# Patient Record
Sex: Female | Born: 1990 | Race: White | Hispanic: No | Marital: Single | Smoking: Never smoker
Health system: Southern US, Community
[De-identification: ages and names within clinical notes are randomized; demographics above are authoritative.]

## PROBLEM LIST (undated history)

## (undated) DIAGNOSIS — F909 Attention-deficit hyperactivity disorder, unspecified type: Secondary | ICD-10-CM

## (undated) HISTORY — PX: WISDOM TOOTH EXTRACTION: SHX21

## (undated) HISTORY — PX: TONSILLECTOMY: SHX5217

## (undated) HISTORY — PX: EXPOSURE OF IMPACTED TOOTH: SHX1546

---

## 2013-08-01 ENCOUNTER — Encounter: Payer: Self-pay | Admitting: Obstetrics and Gynecology

## 2013-08-01 ENCOUNTER — Ambulatory Visit (INDEPENDENT_AMBULATORY_CARE_PROVIDER_SITE_OTHER): Payer: Managed Care, Other (non HMO) | Admitting: Obstetrics and Gynecology

## 2013-08-01 ENCOUNTER — Encounter (INDEPENDENT_AMBULATORY_CARE_PROVIDER_SITE_OTHER): Payer: Self-pay

## 2013-08-01 VITALS — BP 146/90 | Ht 71.0 in | Wt 235.0 lb

## 2013-08-01 DIAGNOSIS — Z309 Encounter for contraceptive management, unspecified: Secondary | ICD-10-CM

## 2013-08-01 DIAGNOSIS — Z3049 Encounter for surveillance of other contraceptives: Secondary | ICD-10-CM

## 2013-08-01 DIAGNOSIS — N926 Irregular menstruation, unspecified: Secondary | ICD-10-CM

## 2013-08-01 DIAGNOSIS — N939 Abnormal uterine and vaginal bleeding, unspecified: Secondary | ICD-10-CM | POA: Insufficient documentation

## 2013-08-01 MED ORDER — LEVONORGEST-ETH ESTRAD 91-DAY 0.15-0.03 &0.01 MG PO TABS: 1.0000 | ORAL_TABLET | Freq: Every day | ORAL | Status: DC

## 2013-08-01 NOTE — Progress Notes (Signed)
Patient ID: Rebecca Dixon, female   DOB: 1991/06/14, 22 y.o.   MRN: [id number] Pt states that she has irregular periods since May. BC was changed about 2 months ago but no improvement.  Hx irregular cycles on ocp's . Cycles were irregular and heavy prior to ocp.  Menarche: age 30. Ocp started at age 45. Has had episodes of skipped withdrawal bleeding, as well as the cycle , when it occurs, extends up to 7-10 days of bleeding into the active pills. Take pills regularly. Prior pills worked on [employer] research [employer]. Pt interested in continuous OCP use , will switch to [facility]. A; contr management

## 2013-08-01 NOTE — Patient Instructions (Signed)
Switch ocp's at the end of your current cycles.

## 2014-01-16 ENCOUNTER — Other Ambulatory Visit (HOSPITAL_COMMUNITY): Payer: Self-pay | Admitting: General Surgery

## 2014-01-16 DIAGNOSIS — R109 Unspecified abdominal pain: Secondary | ICD-10-CM

## 2014-01-24 ENCOUNTER — Ambulatory Visit (HOSPITAL_COMMUNITY)
Admission: RE | Admit: 2014-01-24 | Discharge: 2014-01-24 | Disposition: A | Discharge status: Home / Self Care | Payer: 59 | Source: Ambulatory Visit | Attending: General Surgery | Admitting: General Surgery

## 2014-01-24 DIAGNOSIS — R109 Unspecified abdominal pain: Secondary | ICD-10-CM | POA: Insufficient documentation

## 2014-01-24 DIAGNOSIS — R160 Hepatomegaly, not elsewhere classified: Secondary | ICD-10-CM | POA: Insufficient documentation

## 2014-01-29 ENCOUNTER — Other Ambulatory Visit (HOSPITAL_COMMUNITY): Payer: Self-pay | Admitting: General Surgery

## 2014-01-29 DIAGNOSIS — R109 Unspecified abdominal pain: Secondary | ICD-10-CM

## 2014-02-03 ENCOUNTER — Encounter (HOSPITAL_COMMUNITY)
Admission: RE | Admit: 2014-02-03 | Discharge: 2014-02-03 | Disposition: A | Discharge status: Home / Self Care | Payer: 59 | Source: Ambulatory Visit | Attending: General Surgery | Admitting: General Surgery

## 2014-02-03 ENCOUNTER — Encounter (HOSPITAL_COMMUNITY): Payer: Self-pay

## 2014-02-03 DIAGNOSIS — R109 Unspecified abdominal pain: Secondary | ICD-10-CM | POA: Insufficient documentation

## 2014-02-03 MED ORDER — TECHNETIUM TC 99M MEBROFENIN IV KIT
5.0000 | PACK | Freq: Once | INTRAVENOUS | Status: AC | PRN
  Administered 2014-02-03: 5 via INTRAVENOUS

## 2014-02-03 MED ORDER — STERILE WATER FOR INJECTION IJ SOLN
INTRAMUSCULAR | Status: AC
  Administered 2014-02-03: 2.18 mL via INTRAVENOUS
  Filled 2014-02-03: qty 10

## 2014-02-03 MED ORDER — SINCALIDE 5 MCG IJ SOLR
INTRAMUSCULAR | Status: AC
  Administered 2014-02-03: 2.18 ug via INTRAVENOUS
  Filled 2014-02-03: qty 5

## 2014-06-12 NOTE — H&P (Signed)
  NTS SOAP Note  Vital Signs:  Vitals as of: 06/12/2014: Systolic 137: Diastolic 92: Heart Rate 72: Temp 98.23F: Height 73ft 11in: Weight 242Lbs 0 Ounces: BMI 33.75  BMI : 33.75 kg/m2  Subjective: This 23 Years old Female presents for of abdominal pain.  Has been having intermittent upper abdominal pain with some radiation to the right flank, nausea, bloating, and fatty food intolerance for some time now.  No fever, chills, jaundice.  Review of Symptoms:  Constitutional:unremarkable   Head:unremarkable    Eyes:unremarkable   Nose/Mouth/Throat:unremarkable Cardiovascular:  unremarkable   Respiratory:unremarkable   Gastrointestin    abdominal pain, nausea, heartburn, excessive gas Genitourinary:unremarkable     Musculoskeletal:unremarkable   Skin:unremarkable Hematolgic/Lymphatic:unremarkable     Allergic/Immunologic:unremarkable     Past Medical History:    Reviewed  Past Medical History  Surgical History: tonsillectomy Medical Problems: adhd Allergies: pcn, codeine Medications: aderall, xanax, seasonique   Social History:Reviewed  Social History  Preferred Language: English Race:  White Ethnicity: Not Hispanic / Latino Age: 70 Years    Smoking Status: Never smoker reviewed on 01/16/2014 Functional Status reviewed on 01/16/2014 ------------------------------------------------ Bathing: Normal Cooking: Normal Dressing: Normal Driving: Normal Eating: Normal Managing Meds: Normal Oral Care: Normal Shopping: Normal Toileting: Normal Transferring: Normal Walking: Normal Cognitive Status reviewed on 01/16/2014 ------------------------------------------------ Attention: Normal Decision Making: Normal Language: Normal Memory: Normal Motor: Normal Perception: Normal Problem Solving: Normal Visual and Spatial: Normal   Family History:  Reviewed  Family Health History Mother, Living; Healthy;  Father, Living;  Healthy;     Objective Information: General:  Well appearing, well nourished in no distress.   no scleral icterus Heart:  RRR, no murmur or gallop.  Normal S1, S2.  No S3, S4.  Lungs:    CTA bilaterally, no wheezes, rhonchi, rales.  Breathing unlabored. Abdomen:Soft, NT/ND, no HSM, no masses.  Assessment:Abdominal pain and symptoms of unknown etiology  Diagnoses: Chronic cholecystitis  Procedures: 30865 - OFFICE OUTPATIENT NEW 30 MINUTES    Plan:  Scheduled for laparoscopic cholecystectomy.

## 2014-06-24 ENCOUNTER — Encounter (HOSPITAL_COMMUNITY): Payer: Self-pay | Admitting: Pharmacy Technician

## 2014-07-04 ENCOUNTER — Encounter (HOSPITAL_COMMUNITY): Payer: Self-pay

## 2014-07-04 ENCOUNTER — Encounter (HOSPITAL_COMMUNITY)
Admission: RE | Admit: 2014-07-04 | Discharge: 2014-07-04 | Disposition: A | Discharge status: Home / Self Care | Payer: 59 | Source: Ambulatory Visit | Attending: General Surgery | Admitting: General Surgery

## 2014-07-04 DIAGNOSIS — Z01812 Encounter for preprocedural laboratory examination: Secondary | ICD-10-CM | POA: Diagnosis present

## 2014-07-04 DIAGNOSIS — K811 Chronic cholecystitis: Secondary | ICD-10-CM | POA: Diagnosis present

## 2014-07-04 HISTORY — DX: Attention-deficit hyperactivity disorder, unspecified type: F90.9

## 2014-07-04 LAB — CBC WITH DIFFERENTIAL/PLATELET
Basophils Absolute: 0 10*3/uL (ref 0.0–0.1)
Basophils Relative: 0 % (ref 0–1)
Eosinophils Absolute: 0.2 10*3/uL (ref 0.0–0.7)
Eosinophils Relative: 2 % (ref 0–5)
HEMATOCRIT: 38 % (ref 36.0–46.0)
HEMOGLOBIN: 12.9 g/dL (ref 12.0–15.0)
LYMPHS PCT: 27 % (ref 12–46)
Lymphs Abs: 2.5 10*3/uL (ref 0.7–4.0)
MCH: 31.3 pg (ref 26.0–34.0)
MCHC: 33.9 g/dL (ref 30.0–36.0)
MCV: 92.2 fL (ref 78.0–100.0)
MONO ABS: 0.4 10*3/uL (ref 0.1–1.0)
MONOS PCT: 5 % (ref 3–12)
NEUTROS ABS: 6 10*3/uL (ref 1.7–7.7)
Neutrophils Relative %: 66 % (ref 43–77)
Platelets: 171 10*3/uL (ref 150–400)
RBC: 4.12 MIL/uL (ref 3.87–5.11)
RDW: 13.3 % (ref 11.5–15.5)
WBC: 9.1 10*3/uL (ref 4.0–10.5)

## 2014-07-04 LAB — BASIC METABOLIC PANEL
ANION GAP: 11 (ref 5–15)
BUN: 9 mg/dL (ref 6–23)
CO2: 25 meq/L (ref 19–32)
Calcium: 9.1 mg/dL (ref 8.4–10.5)
Chloride: 104 mEq/L (ref 96–112)
Creatinine, Ser: 0.68 mg/dL (ref 0.50–1.10)
GFR calc Af Amer: >90 mL/min (ref >90)
GFR calc non Af Amer: >90 mL/min (ref >90)
Glucose, Bld: 88 mg/dL (ref 70–99)
Potassium: 4 mEq/L (ref 3.7–5.3)
SODIUM: 140 meq/L (ref 137–147)

## 2014-07-04 LAB — HEPATIC FUNCTION PANEL
ALT: 14 U/L (ref 0–35)
AST: 17 U/L (ref 0–37)
Albumin: 3.6 g/dL (ref 3.5–5.2)
Alkaline Phosphatase: 44 U/L (ref 39–117)
Bilirubin, Direct: <0.2 mg/dL (ref 0.0–0.3)
TOTAL PROTEIN: 6.6 g/dL (ref 6.0–8.3)
Total Bilirubin: 0.4 mg/dL (ref 0.3–1.2)

## 2014-07-04 LAB — HCG, SERUM, QUALITATIVE: Preg, Serum: NEGATIVE

## 2014-07-04 NOTE — Pre-Procedure Instructions (Signed)
Patient given information to sign up for my chart at home. 

## 2014-07-04 NOTE — Patient Instructions (Signed)
Rebecca Dixon  07/04/2014   Your procedure is scheduled on:  07/09/2014  Report to [facility] at  715  AM.  Call this number if you have problems the morning of surgery: [phone]   Remember:   Do not eat food or drink liquids after midnight.   Take these medicines the morning of surgery with A SIP OF WATER:  Zyrtec, vyvanse.   Do not wear jewelry, make-up or nail polish.  Do not wear lotions, powders, or perfumes.   Do not shave 48 hours prior to surgery. Men may shave face and neck.  Do not bring valuables to the hospital.  [facility] is not responsible for any belongings or valuables.               Contacts, dentures or bridgework may not be worn into surgery.  Leave suitcase in the car. After surgery it may be brought to your room.  For patients admitted to the hospital, discharge time is determined by your treatment team.               Patients discharged the day of surgery will not be allowed to drive home.  Name and phone number of your driver: family  Special Instructions: Shower using CHG 2 nights before surgery and the night before surgery.  If you shower the day of surgery use CHG.  Use special wash - you have one bottle of CHG for all showers.  You should use approximately 1/3 of the bottle for each shower.   Please read over the following fact sheets that you were given: Pain Booklet, Coughing and Deep Breathing, Surgical Site Infection Prevention, Anesthesia Post-op Instructions and Care and Recovery After Surgery Laparoscopic Cholecystectomy Laparoscopic cholecystectomy is surgery to remove the gallbladder. The gallbladder is located in the upper right part of the abdomen, behind the liver. It is a storage sac for bile produced in the liver. Bile aids in the digestion and absorption of fats. Cholecystectomy is often done for inflammation of the gallbladder (cholecystitis). This condition is usually caused by a buildup of gallstones (cholelithiasis) in your  gallbladder. Gallstones can block the flow of bile, resulting in inflammation and pain. In severe cases, emergency surgery may be required. When emergency surgery is not required, you will have time to prepare for the procedure. Laparoscopic surgery is an alternative to open surgery. Laparoscopic surgery has a shorter recovery time. Your common bile duct may also need to be examined during the procedure. If stones are found in the common bile duct, they may be removed. LET [facility] CARE PROVIDER KNOW ABOUT:  Any allergies you have.  All medicines you are taking, including vitamins, herbs, eye drops, creams, and over-the-counter medicines.  Previous problems you or members of your family have had with the use of anesthetics.  Any blood disorders you have.  Previous surgeries you have had.  Medical conditions you have. RISKS AND COMPLICATIONS Generally, this is a safe procedure. However, as with any procedure, complications can occur. Possible complications include:  Infection.  Damage to the common bile duct, nerves, arteries, veins, or other internal organs such as the stomach, liver, or intestines.  Bleeding.  A stone may remain in the common bile duct.  A bile leak from the cyst duct that is clipped when your gallbladder is removed.  The need to convert to open surgery, which requires a larger incision in the abdomen. This may be necessary if your surgeon thinks it  is not safe to continue with a laparoscopic procedure. BEFORE THE PROCEDURE  Ask your health care provider about changing or stopping any regular medicines. You will need to stop taking aspirin or blood thinners at least 5 days prior to surgery.  Do not eat or drink anything after midnight the night before surgery.  Let your health care provider know if you develop a cold or other infectious problem before surgery. PROCEDURE   You will be given medicine to make you sleep through the procedure (general  anesthetic). A breathing tube will be placed in your mouth.  When you are asleep, your surgeon will make several small cuts (incisions) in your abdomen.  A thin, lighted tube with a tiny camera on the end (laparoscope) is inserted through one of the small incisions. The camera on the laparoscope sends a picture to a TV screen in the operating room. This gives the surgeon a good view inside your abdomen.  A gas will be pumped into your abdomen. This expands your abdomen so that the surgeon has more room to perform the surgery.  Other tools needed for the procedure are inserted through the other incisions. The gallbladder is removed through one of the incisions.  After the removal of your gallbladder, the incisions will be closed with stitches, staples, or skin glue. AFTER THE PROCEDURE  You will be taken to a recovery area where your progress will be checked often.  You may be allowed to go home the same day if your pain is controlled and you can tolerate liquids. Document Released: 10/03/2005 Document Revised: 07/24/2013 Document Reviewed: 05/15/2013 [facility] Patient Information 2015 [place], [place]. This information is not intended to replace advice given to you by your health care provider. Make sure you discuss any questions you have with your health care provider. PATIENT INSTRUCTIONS POST-ANESTHESIA  IMMEDIATELY FOLLOWING SURGERY:  Do not drive or operate machinery for the first twenty four hours after surgery.  Do not make any important decisions for twenty four hours after surgery or while taking narcotic pain medications or sedatives.  If you develop intractable nausea and vomiting or a severe headache please notify your doctor immediately.  FOLLOW-UP:  Please make an appointment with your surgeon as instructed. You do not need to follow up with anesthesia unless specifically instructed to do so.  WOUND CARE INSTRUCTIONS (if applicable):  Keep a dry clean dressing on the  anesthesia/puncture wound site if there is drainage.  Once the wound has quit draining you may leave it open to air.  Generally you should leave the bandage intact for twenty four hours unless there is drainage.  If the epidural site drains for more than 36-48 hours please call the anesthesia department.  QUESTIONS?:  Please feel free to call your physician or the hospital operator if you have any questions, and they will be happy to assist you.

## 2014-07-09 ENCOUNTER — Encounter (HOSPITAL_COMMUNITY): Payer: Self-pay | Admitting: *Deleted

## 2014-07-09 ENCOUNTER — Encounter (HOSPITAL_COMMUNITY)
Admission: RE | Disposition: A | Discharge status: Home / Self Care | Payer: Self-pay | Source: Ambulatory Visit | Attending: General Surgery

## 2014-07-09 ENCOUNTER — Ambulatory Visit (HOSPITAL_COMMUNITY)
Admission: RE | Admit: 2014-07-09 | Discharge: 2014-07-09 | Disposition: A | Discharge status: Home / Self Care | Payer: 59 | Source: Ambulatory Visit | Attending: General Surgery | Admitting: General Surgery

## 2014-07-09 ENCOUNTER — Ambulatory Visit (HOSPITAL_COMMUNITY): Payer: 59 | Admitting: Anesthesiology

## 2014-07-09 ENCOUNTER — Encounter (HOSPITAL_COMMUNITY): Payer: 59 | Admitting: Anesthesiology

## 2014-07-09 DIAGNOSIS — IMO0002 Reserved for concepts with insufficient information to code with codable children: Secondary | ICD-10-CM | POA: Diagnosis not present

## 2014-07-09 DIAGNOSIS — K811 Chronic cholecystitis: Secondary | ICD-10-CM | POA: Insufficient documentation

## 2014-07-09 HISTORY — PX: CHOLECYSTECTOMY: SHX55

## 2014-07-09 SURGERY — LAPAROSCOPIC CHOLECYSTECTOMY
Anesthesia: General

## 2014-07-09 MED ORDER — ONDANSETRON HCL 4 MG/2ML IJ SOLN: 4.0000 mg | Freq: Once | INTRAMUSCULAR | Status: DC | PRN

## 2014-07-09 MED ORDER — KETOROLAC TROMETHAMINE 30 MG/ML IJ SOLN
30.0000 mg | Freq: Once | INTRAMUSCULAR | Status: AC
  Administered 2014-07-09: 30 mg via INTRAVENOUS

## 2014-07-09 MED ORDER — PROPOFOL 10 MG/ML IV EMUL
INTRAVENOUS | Status: AC
  Filled 2014-07-09: qty 20

## 2014-07-09 MED ORDER — ROCURONIUM BROMIDE 100 MG/10ML IV SOLN
INTRAVENOUS | Status: DC | PRN
  Administered 2014-07-09: 30 mg via INTRAVENOUS

## 2014-07-09 MED ORDER — MIDAZOLAM HCL 2 MG/2ML IJ SOLN
INTRAMUSCULAR | Status: AC
  Filled 2014-07-09: qty 2

## 2014-07-09 MED ORDER — BUPIVACAINE HCL (PF) 0.5 % IJ SOLN
INTRAMUSCULAR | Status: DC | PRN
  Administered 2014-07-09: 10 mL

## 2014-07-09 MED ORDER — GLYCOPYRROLATE 0.2 MG/ML IJ SOLN
INTRAMUSCULAR | Status: DC | PRN
  Administered 2014-07-09: 0.6 mg via INTRAVENOUS

## 2014-07-09 MED ORDER — FENTANYL CITRATE 0.05 MG/ML IJ SOLN
INTRAMUSCULAR | Status: DC | PRN
  Administered 2014-07-09: 100 ug via INTRAVENOUS
  Administered 2014-07-09 (×5): 50 ug via INTRAVENOUS

## 2014-07-09 MED ORDER — POVIDONE-IODINE 10 % OINT PACKET
TOPICAL_OINTMENT | CUTANEOUS | Status: DC | PRN
  Administered 2014-07-09: 1 via TOPICAL

## 2014-07-09 MED ORDER — FENTANYL CITRATE 0.05 MG/ML IJ SOLN
INTRAMUSCULAR | Status: AC
  Filled 2014-07-09: qty 5

## 2014-07-09 MED ORDER — DEXAMETHASONE SODIUM PHOSPHATE 4 MG/ML IJ SOLN
INTRAMUSCULAR | Status: AC
  Filled 2014-07-09: qty 1

## 2014-07-09 MED ORDER — NEOSTIGMINE METHYLSULFATE 10 MG/10ML IV SOLN
INTRAVENOUS | Status: AC
  Filled 2014-07-09: qty 3

## 2014-07-09 MED ORDER — KETOROLAC TROMETHAMINE 30 MG/ML IJ SOLN
INTRAMUSCULAR | Status: AC
  Filled 2014-07-09: qty 1

## 2014-07-09 MED ORDER — LACTATED RINGERS IV SOLN
INTRAVENOUS | Status: DC | PRN
  Administered 2014-07-09 (×2): via INTRAVENOUS

## 2014-07-09 MED ORDER — HYDROCODONE-ACETAMINOPHEN 5-325 MG PO TABS: 1.0000 | ORAL_TABLET | ORAL | Status: AC | PRN

## 2014-07-09 MED ORDER — CIPROFLOXACIN IN D5W 400 MG/200ML IV SOLN
INTRAVENOUS | Status: AC
  Filled 2014-07-09: qty 200

## 2014-07-09 MED ORDER — FENTANYL CITRATE 0.05 MG/ML IJ SOLN
INTRAMUSCULAR | Status: AC
  Filled 2014-07-09: qty 2

## 2014-07-09 MED ORDER — HEMOSTATIC AGENTS (NO CHARGE) OPTIME
TOPICAL | Status: DC | PRN
  Administered 2014-07-09: 1 via TOPICAL

## 2014-07-09 MED ORDER — CIPROFLOXACIN IN D5W 400 MG/200ML IV SOLN
400.0000 mg | Freq: Once | INTRAVENOUS | Status: AC
  Administered 2014-07-09: 400 mg via INTRAVENOUS

## 2014-07-09 MED ORDER — SODIUM CHLORIDE 0.9 % IR SOLN
Status: DC | PRN
  Administered 2014-07-09: 1000 mL

## 2014-07-09 MED ORDER — SODIUM CHLORIDE 0.9 % IJ SOLN
INTRAMUSCULAR | Status: AC
  Filled 2014-07-09: qty 10

## 2014-07-09 MED ORDER — EPHEDRINE SULFATE 50 MG/ML IJ SOLN
INTRAMUSCULAR | Status: AC
  Filled 2014-07-09: qty 1

## 2014-07-09 MED ORDER — ONDANSETRON HCL 4 MG PO TABS: 4.0000 mg | ORAL_TABLET | Freq: Three times a day (TID) | ORAL | Status: AC | PRN

## 2014-07-09 MED ORDER — CHLORHEXIDINE GLUCONATE 4 % EX LIQD: 1.0000 "application " | Freq: Once | CUTANEOUS | Status: DC

## 2014-07-09 MED ORDER — MIDAZOLAM HCL 2 MG/2ML IJ SOLN
1.0000 mg | INTRAMUSCULAR | Status: DC | PRN
  Administered 2014-07-09 (×2): 2 mg via INTRAVENOUS
  Filled 2014-07-09: qty 2

## 2014-07-09 MED ORDER — POVIDONE-IODINE 10 % EX OINT
TOPICAL_OINTMENT | CUTANEOUS | Status: AC
  Filled 2014-07-09: qty 1

## 2014-07-09 MED ORDER — DEXAMETHASONE SODIUM PHOSPHATE 4 MG/ML IJ SOLN
4.0000 mg | Freq: Once | INTRAMUSCULAR | Status: AC
  Administered 2014-07-09: 4 mg via INTRAVENOUS

## 2014-07-09 MED ORDER — SUCCINYLCHOLINE CHLORIDE 20 MG/ML IJ SOLN
INTRAMUSCULAR | Status: AC
  Filled 2014-07-09: qty 1

## 2014-07-09 MED ORDER — ROCURONIUM BROMIDE 50 MG/5ML IV SOLN
INTRAVENOUS | Status: AC
  Filled 2014-07-09: qty 1

## 2014-07-09 MED ORDER — LIDOCAINE HCL (CARDIAC) 20 MG/ML IV SOLN
INTRAVENOUS | Status: DC | PRN
  Administered 2014-07-09: 50 mg via INTRAVENOUS

## 2014-07-09 MED ORDER — LACTATED RINGERS IV SOLN
INTRAVENOUS | Status: DC
  Administered 2014-07-09: 1000 mL via INTRAVENOUS

## 2014-07-09 MED ORDER — GLYCOPYRROLATE 0.2 MG/ML IJ SOLN
INTRAMUSCULAR | Status: AC
  Filled 2014-07-09: qty 3

## 2014-07-09 MED ORDER — ONDANSETRON HCL 4 MG/2ML IJ SOLN
4.0000 mg | Freq: Once | INTRAMUSCULAR | Status: AC
  Administered 2014-07-09: 4 mg via INTRAVENOUS

## 2014-07-09 MED ORDER — ARTIFICIAL TEARS OP OINT
TOPICAL_OINTMENT | OPHTHALMIC | Status: AC
  Filled 2014-07-09: qty 7

## 2014-07-09 MED ORDER — NEOSTIGMINE METHYLSULFATE 10 MG/10ML IV SOLN
INTRAVENOUS | Status: DC | PRN
  Administered 2014-07-09: 5 mg via INTRAVENOUS

## 2014-07-09 MED ORDER — PROPOFOL 10 MG/ML IV BOLUS
INTRAVENOUS | Status: DC | PRN
  Administered 2014-07-09: 160 mg via INTRAVENOUS

## 2014-07-09 MED ORDER — FENTANYL CITRATE 0.05 MG/ML IJ SOLN
25.0000 ug | INTRAMUSCULAR | Status: DC | PRN
  Administered 2014-07-09 (×2): 50 ug via INTRAVENOUS
  Filled 2014-07-09: qty 2

## 2014-07-09 MED ORDER — BUPIVACAINE HCL (PF) 0.5 % IJ SOLN
INTRAMUSCULAR | Status: AC
  Filled 2014-07-09: qty 30

## 2014-07-09 MED ORDER — LIDOCAINE HCL (PF) 1 % IJ SOLN
INTRAMUSCULAR | Status: AC
  Filled 2014-07-09: qty 5

## 2014-07-09 MED ORDER — ONDANSETRON HCL 4 MG/2ML IJ SOLN
INTRAMUSCULAR | Status: AC
  Filled 2014-07-09: qty 2

## 2014-07-09 SURGICAL SUPPLY — 44 items
APPLIER CLIP LAPSCP 10X32 DD (CLIP) ×3 IMPLANT
BAG HAMPER (MISCELLANEOUS) ×3 IMPLANT
BLADE 11 SAFETY STRL DISP (BLADE) IMPLANT
CLOTH BEACON ORANGE TIMEOUT ST (SAFETY) ×3 IMPLANT
COVER LIGHT HANDLE STERIS (MISCELLANEOUS) ×6 IMPLANT
DECANTER SPIKE VIAL GLASS SM (MISCELLANEOUS) ×3 IMPLANT
DURAPREP 26ML APPLICATOR (WOUND CARE) ×3 IMPLANT
ELECT REM PT RETURN 9FT ADLT (ELECTROSURGICAL) ×3
ELECTRODE REM PT RTRN 9FT ADLT (ELECTROSURGICAL) ×1 IMPLANT
FILTER SMOKE EVAC LAPAROSHD (FILTER) ×3 IMPLANT
FORMALIN 10 PREFIL 120ML (MISCELLANEOUS) ×3 IMPLANT
GLOVE BIO SURGEON STRL SZ 6.5 (GLOVE) ×2 IMPLANT
GLOVE BIO SURGEONS STRL SZ 6.5 (GLOVE) ×1
GLOVE BIOGEL PI IND STRL 7.0 (GLOVE) ×1 IMPLANT
GLOVE BIOGEL PI INDICATOR 7.0 (GLOVE) ×2
GLOVE ECLIPSE 6.5 STRL STRAW (GLOVE) ×3 IMPLANT
GLOVE SURG SS PI 7.5 STRL IVOR (GLOVE) ×3 IMPLANT
GOWN STRL REUS W/ TWL XL LVL3 (GOWN DISPOSABLE) ×1 IMPLANT
GOWN STRL REUS W/TWL LRG LVL3 (GOWN DISPOSABLE) ×6 IMPLANT
GOWN STRL REUS W/TWL XL LVL3 (GOWN DISPOSABLE) ×2
HEMOSTAT SNOW SURGICEL 2X4 (HEMOSTASIS) ×3 IMPLANT
INST SET LAPROSCOPIC AP (KITS) ×3 IMPLANT
IV NS IRRIG 3000ML ARTHROMATIC (IV SOLUTION) IMPLANT
KIT ROOM TURNOVER APOR (KITS) ×3 IMPLANT
MANIFOLD NEPTUNE II (INSTRUMENTS) ×3 IMPLANT
NEEDLE INSUFFLATION 14GA 120MM (NEEDLE) ×3 IMPLANT
NS IRRIG 1000ML POUR BTL (IV SOLUTION) ×3 IMPLANT
PACK LAP CHOLE LZT030E (CUSTOM PROCEDURE TRAY) ×3 IMPLANT
PAD ARMBOARD 7.5X6 YLW CONV (MISCELLANEOUS) ×3 IMPLANT
POUCH SPECIMEN RETRIEVAL 10MM (ENDOMECHANICALS) ×3 IMPLANT
SET BASIN LINEN APH (SET/KITS/TRAYS/PACK) ×3 IMPLANT
SET TUBE IRRIG SUCTION NO TIP (IRRIGATION / IRRIGATOR) IMPLANT
SLEEVE ENDOPATH XCEL 5M (ENDOMECHANICALS) ×3 IMPLANT
SPONGE GAUZE 2X2 8PLY STER LF (GAUZE/BANDAGES/DRESSINGS) ×4
SPONGE GAUZE 2X2 8PLY STRL LF (GAUZE/BANDAGES/DRESSINGS) ×8 IMPLANT
STAPLER VISISTAT (STAPLE) ×3 IMPLANT
SUT VICRYL 0 UR6 27IN ABS (SUTURE) ×3 IMPLANT
TAPE CLOTH SURG 4X10 WHT LF (GAUZE/BANDAGES/DRESSINGS) ×3 IMPLANT
TROCAR ENDO BLADELESS 11MM (ENDOMECHANICALS) ×3 IMPLANT
TROCAR XCEL NON-BLD 5MMX100MML (ENDOMECHANICALS) ×3 IMPLANT
TROCAR XCEL UNIV SLVE 11M 100M (ENDOMECHANICALS) ×3 IMPLANT
TUBING INSUFFLATION (TUBING) ×3 IMPLANT
WARMER LAPAROSCOPE (MISCELLANEOUS) ×3 IMPLANT
YANKAUER SUCT 12FT TUBE ARGYLE (SUCTIONS) ×3 IMPLANT

## 2014-07-09 NOTE — Op Note (Signed)
Patient:  Rebecca Dixon  DOB:  1991/07/02  MRN:  [id number]   Preop Diagnosis:  Chronic cholecystitis  Postop Diagnosis:  Same  Procedure:  Laparoscopic cholecystectomy  Surgeon:  Franky Macho, M.D.  Anes:  General endotracheal  Indications:  Patient is a 23 year old white female presents with biliary colic secondary to chronic cholecystitis. The risks and benefits of the procedure including bleeding, infection, hepatobiliary injury, and the possibility of an open procedure were fully explained to the patient, who gave informed consent.  Procedure note:  The patient is placed the supine position. After induction of general endotracheal anesthesia, the abdomen was prepped and draped using usual sterile technique with DuraPrep. Surgical site confirmation was performed.  A supraumbilical incision was made down to the fascia. A Veress needle was introduced into the abdominal cavity and confirmation of placement was done using the saline drop test. The abdomen was then insufflated to 16 mm mercury pressure. An 11 mm trocar was introduced into the abdominal cavity under direct visualization without difficulty. The patient was placed in reverse Trendelenburg position and additional 11 mm trocar was placed the epigastric region and 5 mm trochars were placed were upper quadrant and right flank regions. The liver was inspected and noted limits. There were some adhesions of omentum and bowel to the gallbladder wall which were freed away sharply without difficulty. The gallbladder was retracted in a dynamic fashion in order to expose the triangle of Calot. The cystic duct was first identified. Its junction to the infundibulum was fully identified. Endoclips placed proximally distally on the cystic duct, and the cystic duct was divided. This was likewise done cystic artery. The gallbladder was freed away from the gallbladder fossa using Bovie electrocautery. The gallbladder delivered through the epigastric  trocar site using an Endo Catch bag. The gallbladder fossa was inspected and no abnormal bleeding or bile leakage was noted. Surgicel is placed the gallbladder fossa. All fluid and air were then evacuated from the abdominal cavity prior to removal of the trochars.  All wounds were irrigated normal saline. All wounds were checked with .5% Sensorcaine. The supraumbilical fascia was reapproximated using 0 Vicryl interrupted suture. All skin incisions were closed using staples. Betadine ointment and dry sterile dressings were applied.  All tape and needle counts were correct at the end of the procedure. Patient was extubated in the operating room and transferred to PACU in stable condition.  Complications:  None  EBL:  Minimal  Specimen:  Gallbladder

## 2014-07-09 NOTE — Discharge Instructions (Signed)

## 2014-07-09 NOTE — Anesthesia Preprocedure Evaluation (Signed)
Anesthesia Evaluation  Patient identified by MRN, date of birth, ID band Patient awake    Reviewed: Allergy & Precautions, H&P , NPO status , Patient's Chart, lab work & pertinent test results  Airway Mallampati: II TM Distance: >3 FB     Dental  (+) Teeth Intact   Pulmonary neg pulmonary ROS,  breath sounds clear to auscultation        Cardiovascular negative cardio ROS  Rhythm:Regular Rate:Normal     Neuro/Psych PSYCHIATRIC DISORDERS (ADHD)    GI/Hepatic negative GI ROS,   Endo/Other    Renal/GU      Musculoskeletal   Abdominal   Peds  Hematology   Anesthesia Other Findings   Reproductive/Obstetrics                           Anesthesia Physical Anesthesia Plan  ASA: II  Anesthesia Plan: General   Post-op Pain Management:    Induction: Intravenous  Airway Management Planned: Oral ETT  Additional Equipment:   Intra-op Plan:   Post-operative Plan: Extubation in OR  Informed Consent: I have reviewed the patients History and Physical, chart, labs and discussed the procedure including the risks, benefits and alternatives for the proposed anesthesia with the patient or authorized representative who has indicated his/her understanding and acceptance.     Plan Discussed with:   Anesthesia Plan Comments:         Anesthesia Quick Evaluation

## 2014-07-09 NOTE — Interval H&P Note (Signed)
History and Physical Interval Note:  07/09/2014 8:25 AM  Rebecca Dixon  has presented today for surgery, with the diagnosis of chronic cholecystitis  The various methods of treatment have been discussed with the patient and family. After consideration of risks, benefits and other options for treatment, the patient has consented to  Procedure(s): LAPAROSCOPIC CHOLECYSTECTOMY (N/A) as a surgical intervention .  The patient's history has been reviewed, patient examined, no change in status, stable for surgery.  I have reviewed the patient's chart and labs.  Questions were answered to the patient's satisfaction.     Franky Macho A

## 2014-07-09 NOTE — Transfer of Care (Signed)
Immediate Anesthesia Transfer of Care Note  Patient: Rebecca Dixon  Procedure(s) Performed: Procedure(s): LAPAROSCOPIC CHOLECYSTECTOMY (N/A)  Patient Location: PACU  Anesthesia Type:General  Level of Consciousness: awake, oriented, sedated and patient cooperative  Airway & Oxygen Therapy: Patient Spontanous Breathing and Patient connected to face mask oxygen  Post-op Assessment: Report given to PACU RN and Post -op Vital signs reviewed and stable  Post vital signs: Reviewed and stable  Complications: No apparent anesthesia complications

## 2014-07-09 NOTE — Anesthesia Procedure Notes (Signed)
Procedure Name: Intubation Date/Time: 07/09/2014 8:40 AM Performed by: Pernell Dupre, Shanaye Rief A Pre-anesthesia Checklist: Patient identified, Patient being monitored, Timeout performed, Emergency Drugs available and Suction available Patient Re-evaluated:Patient Re-evaluated prior to inductionOxygen Delivery Method: Circle System Utilized Preoxygenation: Pre-oxygenation with 100% oxygen Intubation Type: IV induction Ventilation: Mask ventilation without difficulty Laryngoscope Size: 3 and Miller Grade View: Grade I Tube type: Oral Tube size: 7.0 mm Number of attempts: 1 Airway Equipment and Method: stylet Placement Confirmation: ETT inserted through vocal cords under direct vision,  positive ETCO2 and breath sounds checked- equal and bilateral Secured at: 21 cm Tube secured with: Tape Dental Injury: Teeth and Oropharynx as per pre-operative assessment

## 2014-07-09 NOTE — Anesthesia Postprocedure Evaluation (Signed)
  Anesthesia Post-op Note  Patient: Rebecca Dixon  Procedure(s) Performed: Procedure(s): LAPAROSCOPIC CHOLECYSTECTOMY (N/A)  Patient Location: PACU  Anesthesia Type:General  Level of Consciousness: awake, oriented, sedated and patient cooperative  Airway and Oxygen Therapy: Patient Spontanous Breathing and Patient connected to face mask oxygen  Post-op Pain: mild  Post-op Assessment: Post-op Vital signs reviewed, Patient's Cardiovascular Status Stable, Respiratory Function Stable, Patent Airway, No signs of Nausea or vomiting and Pain level controlled  Post-op Vital Signs: Reviewed and stable  Last Vitals:  Filed Vitals:   07/09/14 0825  BP: 125/91  Pulse:   Temp:   Resp: 17    Complications: No apparent anesthesia complications

## 2014-07-10 ENCOUNTER — Encounter (HOSPITAL_COMMUNITY): Payer: Self-pay | Admitting: General Surgery

## 2014-08-12 ENCOUNTER — Other Ambulatory Visit: Payer: Self-pay | Admitting: Obstetrics and Gynecology

## 2015-02-03 ENCOUNTER — Other Ambulatory Visit: Payer: Self-pay | Admitting: *Deleted

## 2015-02-03 MED ORDER — LEVONORGEST-ETH ESTRAD 91-DAY 0.15-0.03 &0.01 MG PO TABS: 1.0000 | ORAL_TABLET | Freq: Every day | ORAL | Status: DC

## 2015-02-05 ENCOUNTER — Other Ambulatory Visit: Payer: Self-pay | Admitting: *Deleted

## 2015-02-10 ENCOUNTER — Other Ambulatory Visit: Payer: Self-pay | Admitting: *Deleted

## 2015-02-10 MED ORDER — LEVONORGEST-ETH ESTRAD 91-DAY 0.15-0.03 &0.01 MG PO TABS: 1.0000 | ORAL_TABLET | Freq: Every day | ORAL | Status: DC

## 2015-02-10 NOTE — Telephone Encounter (Signed)
Pt had a year's refils written on 4//19/16 as per chart review. Pt not seen here since 2014, and needs recall offered.

## 2015-02-10 NOTE — Telephone Encounter (Signed)
According to epic, pt has Rx filled on 02/03/15.

## 2015-06-28 IMAGING — US US ABDOMEN LIMITED
1 series · 14 of 25 positions shown · non-contrast
Comparison: None.

CLINICAL DATA: Upper abdominal pain

EXAM:
US ABDOMEN LIMITED - RIGHT UPPER QUADRANT

[Series 1: us abdomen limited · 0.17mm/px · 14 of 57 slices shown]
[im 1/57]
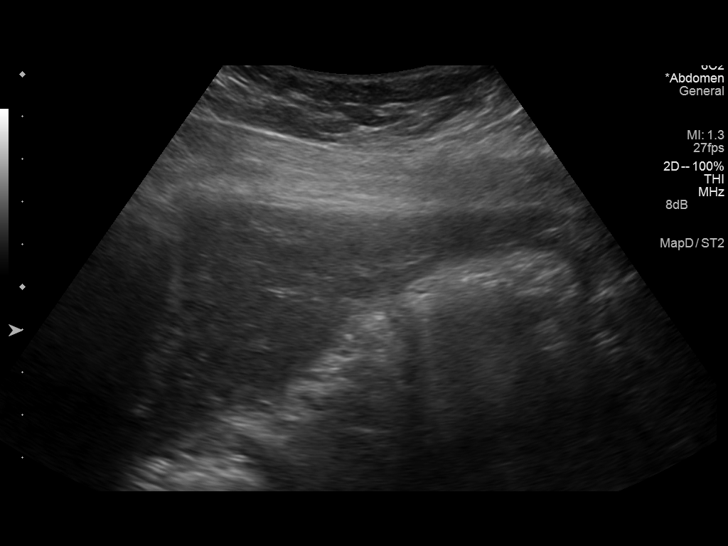
[im 5/57]
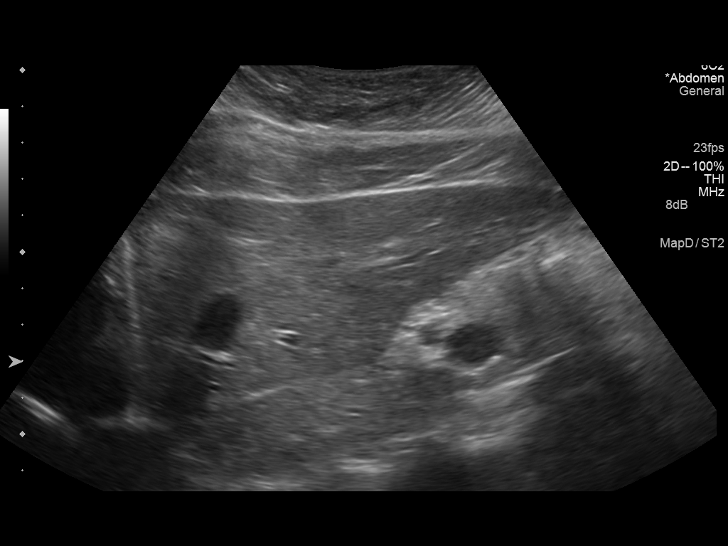
[im 10/57]
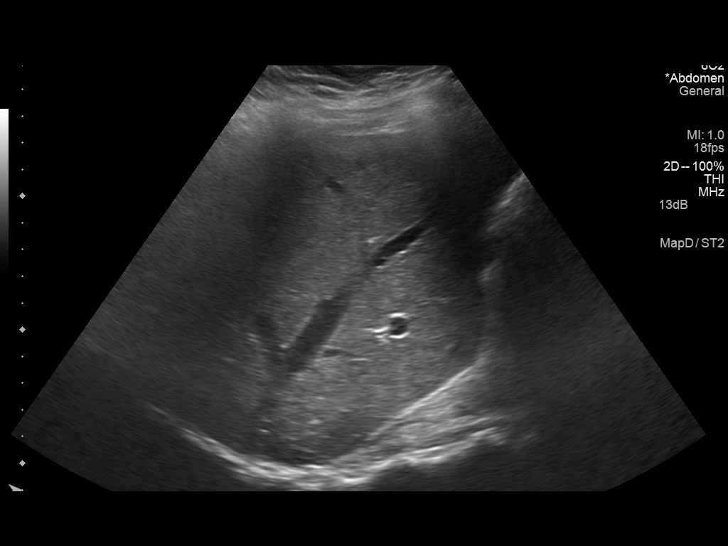
[im 15/57]
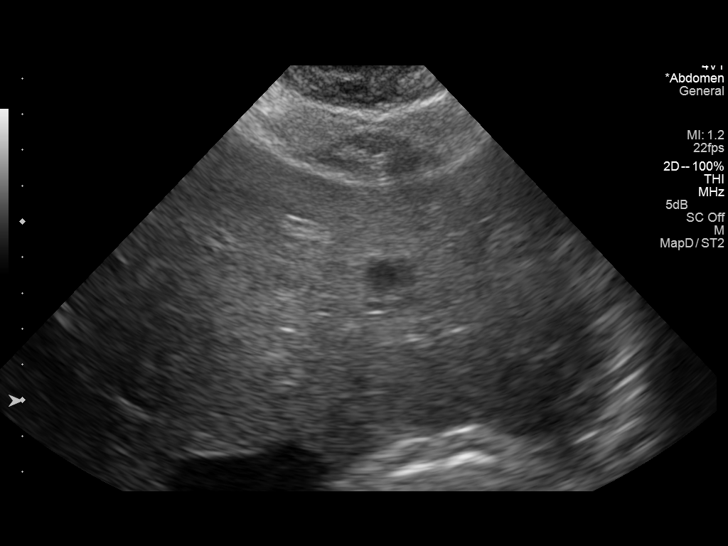
[im 19/57]
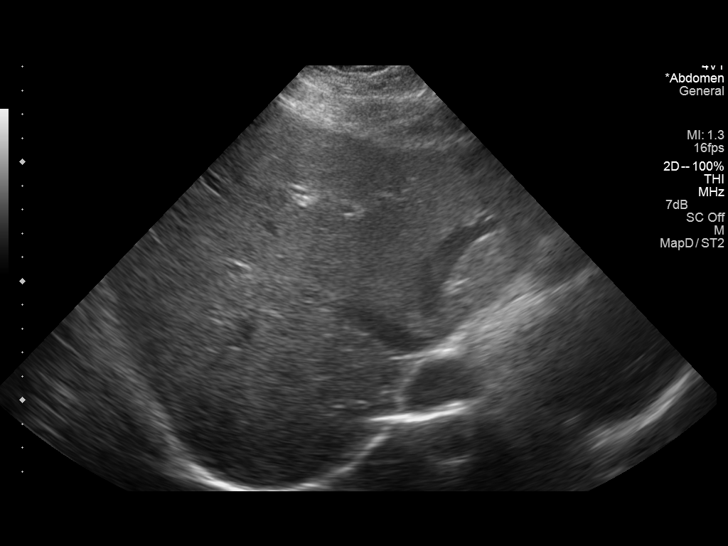
[im 22/57]
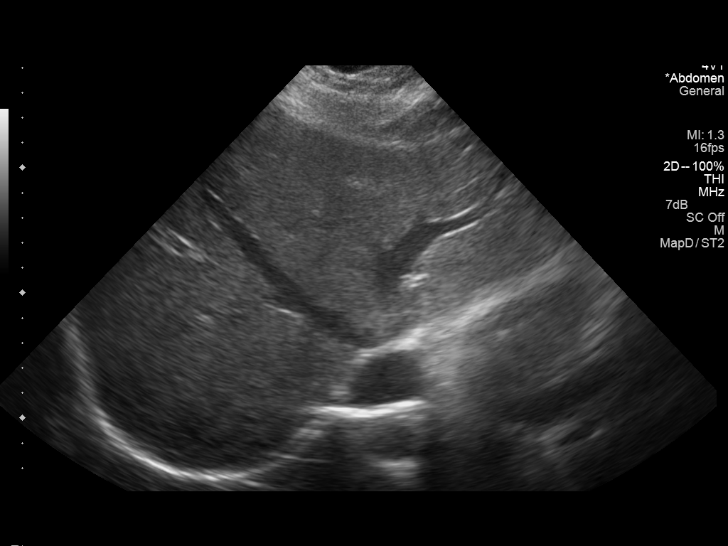
[im 26/57]
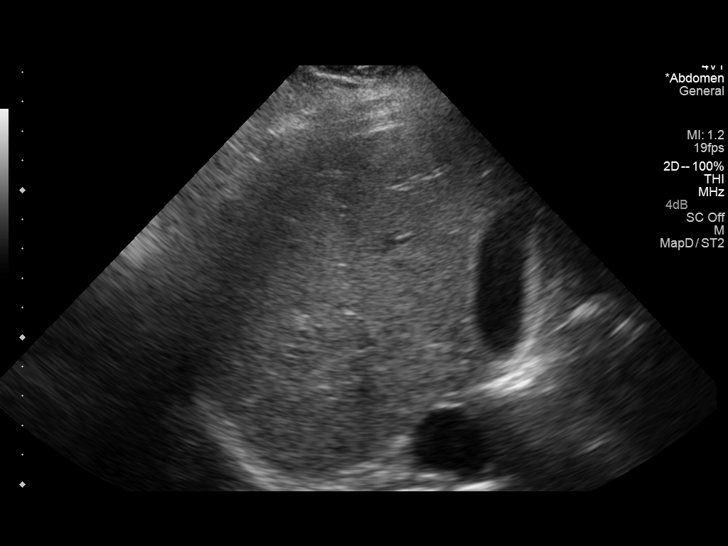
[im 31/57]
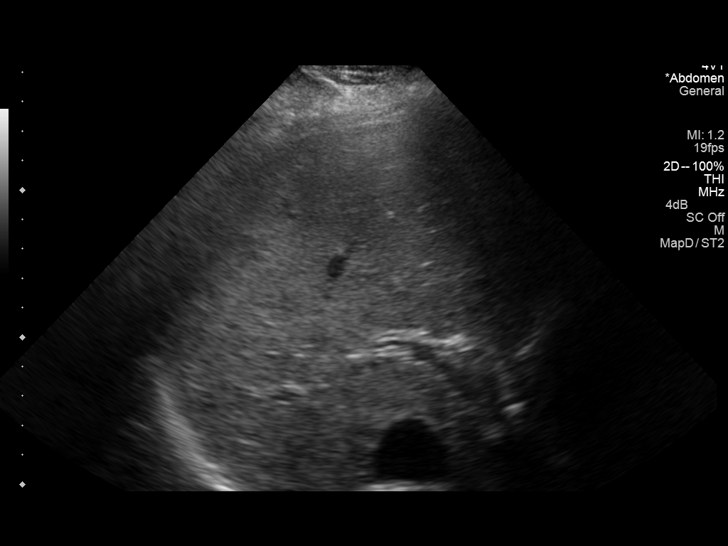
[im 36/57]
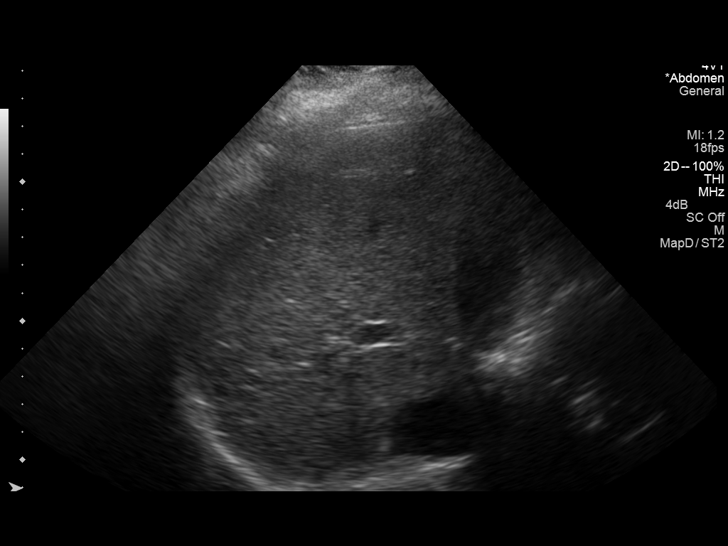
[im 38/57]
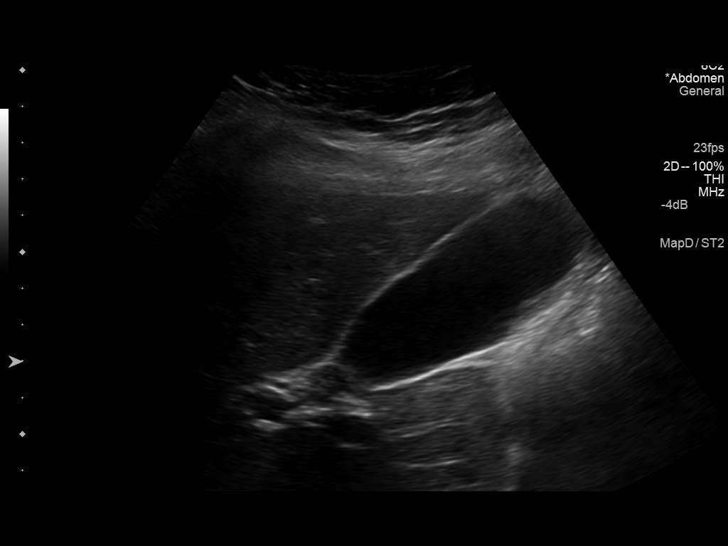
[im 43/57]
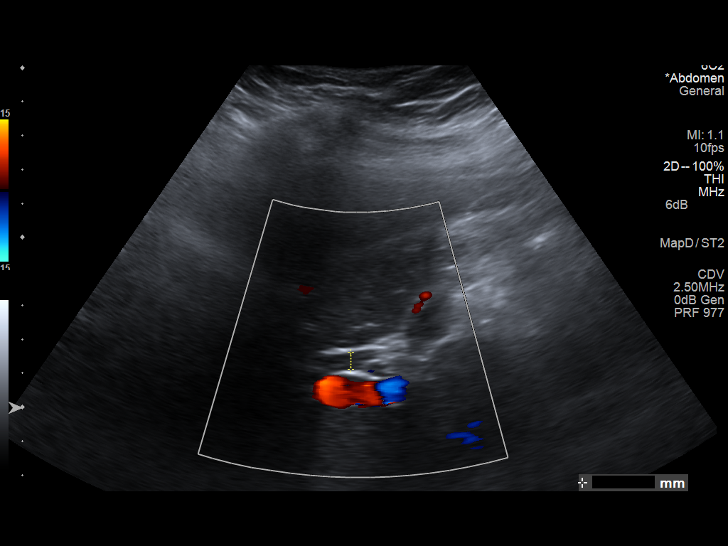
[im 47/57]
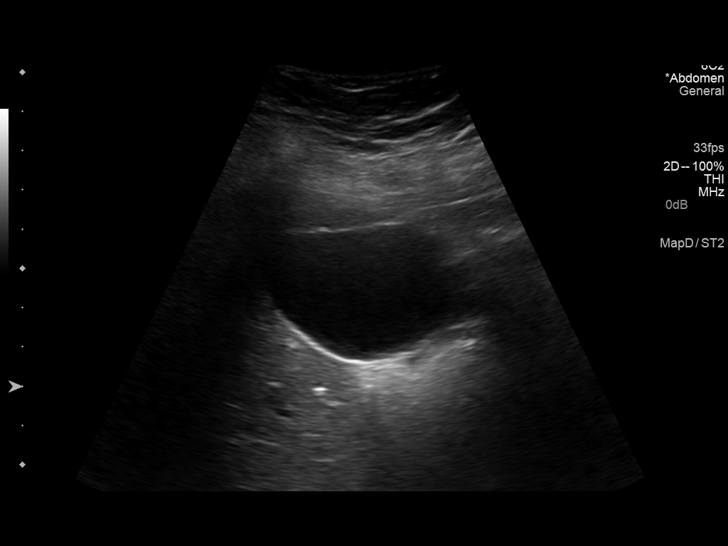
[im 52/57]
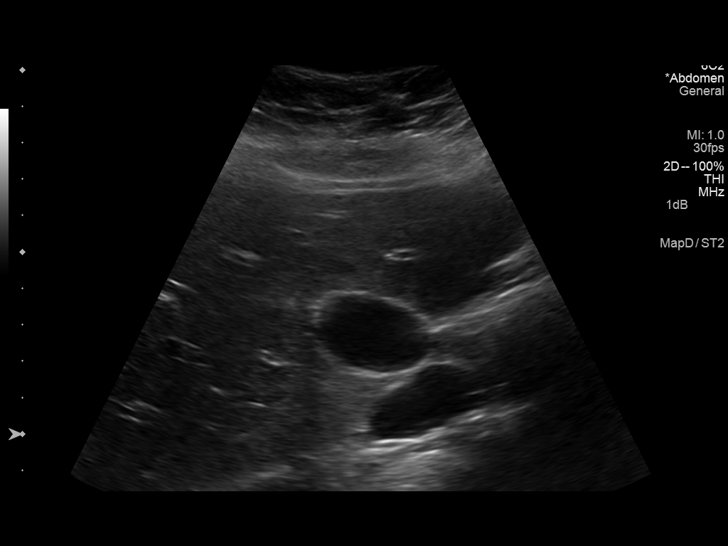
[im 57/57]
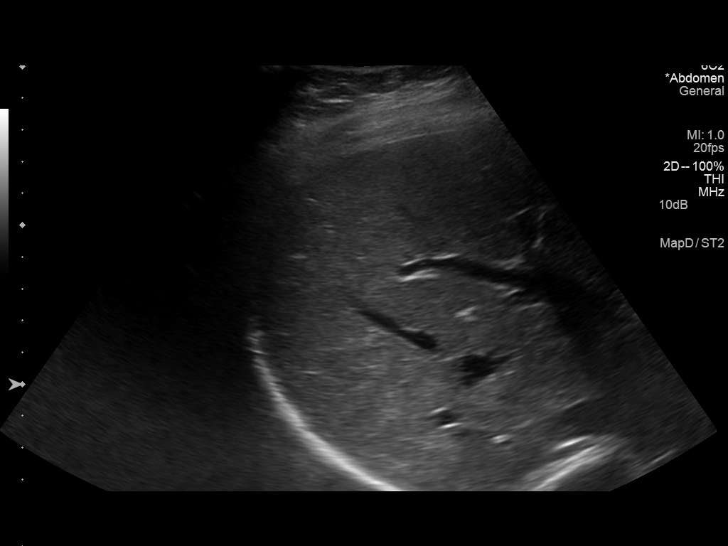

[14 of 25 positions shown; findings below may reference images not displayed]

FINDINGS: Gallbladder:

No gallstones or wall thickening visualized. There is no
pericholecystic fluid. No sonographic Murphy sign noted.

Common bile duct:

Diameter: 5 mm. There is no intrahepatic or extrahepatic biliary
duct dilatation.

Liver:

No focal lesion identified. Within normal limits in parenchymal
echogenicity. Liver is enlarged, measuring 16.9 cm in length
IMPRESSION: Liver enlarged but otherwise normal in appearance. Study otherwise
unremarkable.

## 2015-07-08 IMAGING — NM NM HEPATO W/GB/PHARM/[PERSON_NAME]
2 series · 12 of 12 positions shown · non-contrast
Comparison: None.

CLINICAL DATA: Abdominal pain

EXAM:
NUCLEAR MEDICINE HEPATOBILIARY IMAGING WITH GALLBLADDER EF
TECHNIQUE: Sequential images of the abdomen were obtained [DATE] minutes
following intravenous administration of radiopharmaceutical. After
slow intravenous infusion of 2.18 micrograms Cholecystokinin,
gallbladder ejection fraction was determined.
RADIOPHARMACEUTICALS:  5.0  DDiCc-EEm Choletec

[Series 1: hida · 3.20mm/px · 6 of 60 frames shown (1 of 2)]
[frame 6/60]
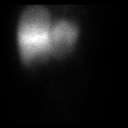
[frame 16/60]
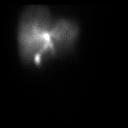
[frame 26/60]
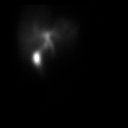
[frame 36/60]
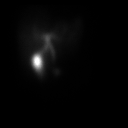
[frame 46/60]
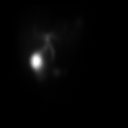
[frame 56/60]
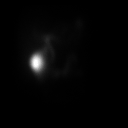

[Series 1: hida · 3.20mm/px · 6 of 30 frames shown (2 of 2)]
[frame 3/30]
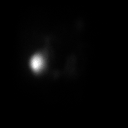
[frame 8/30]
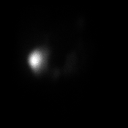
[frame 13/30]
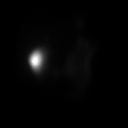
[frame 18/30]
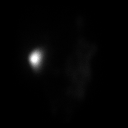
[frame 23/30]
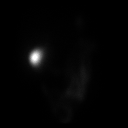
[frame 28/30]
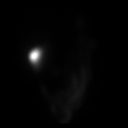

[12 of 12 positions shown; findings below may reference images not displayed]

FINDINGS: There is immediate homogeneous uptake of radiotracer in the liver.
Filling of the gallbladder begins at 20 minutes. Radiotracer uptake
is present in the small bowel at 50 minutes.

At the end of 1-hour, there is relatively good clearance of activity
from the liver.

When gallbladder filling was complete, the patient was given an IV
infusion of CCK. The gallbladder ejection fraction at 30 min
measures 21.5%. At 30 min, normal ejection fraction is greater than
30%.

The patient did experience symptoms during CCK infusion.
IMPRESSION: 1. No hepatobiliary obstruction.
2. Abnormal low gallbladder ejection fraction as can be seen with
biliary dyskinesia.

## 2016-02-16 ENCOUNTER — Other Ambulatory Visit: Payer: Self-pay | Admitting: Obstetrics and Gynecology

## 2016-02-17 NOTE — Telephone Encounter (Signed)
refil x 1 needs appt

## 2016-06-24 ENCOUNTER — Other Ambulatory Visit: Payer: Self-pay | Admitting: Obstetrics and Gynecology

## 2016-07-13 ENCOUNTER — Other Ambulatory Visit: Payer: Self-pay | Admitting: *Deleted

## 2016-07-15 MED ORDER — LEVONORGEST-ETH ESTRAD 91-DAY 0.15-0.03 &0.01 MG PO TABS: 1.0000 | ORAL_TABLET | Freq: Every day | ORAL | 1 refills | Status: AC

## 2016-07-18 ENCOUNTER — Telehealth: Payer: Self-pay | Admitting: Obstetrics and Gynecology

## 2016-07-18 NOTE — Telephone Encounter (Signed)
Pt informed Rx for birth control Rebecca Dixon([name]) e-scribed on 07/15/2016. Pt verbalized understanding.

## 2016-08-29 ENCOUNTER — Ambulatory Visit: Payer: Self-pay | Admitting: Psychology

## 2016-09-06 ENCOUNTER — Ambulatory Visit (INDEPENDENT_AMBULATORY_CARE_PROVIDER_SITE_OTHER): Payer: 59 | Admitting: Psychology

## 2016-09-06 DIAGNOSIS — F4323 Adjustment disorder with mixed anxiety and depressed mood: Secondary | ICD-10-CM

## 2016-09-21 ENCOUNTER — Ambulatory Visit: Payer: 59 | Admitting: Psychology

## 2016-12-29 ENCOUNTER — Other Ambulatory Visit: Payer: Self-pay | Admitting: Obstetrics and Gynecology

## 2017-04-24 ENCOUNTER — Ambulatory Visit: Payer: 59 | Admitting: Psychology

## 2017-05-11 ENCOUNTER — Ambulatory Visit: Payer: 59 | Admitting: Psychology

## 2017-07-21 ENCOUNTER — Ambulatory Visit (INDEPENDENT_AMBULATORY_CARE_PROVIDER_SITE_OTHER): Payer: 59 | Admitting: Psychology

## 2017-07-21 DIAGNOSIS — F411 Generalized anxiety disorder: Secondary | ICD-10-CM | POA: Diagnosis not present

## 2017-08-07 ENCOUNTER — Ambulatory Visit: Payer: Self-pay | Admitting: Psychology

## 2019-01-02 ENCOUNTER — Other Ambulatory Visit: Payer: Self-pay

## 2019-01-02 ENCOUNTER — Ambulatory Visit (INDEPENDENT_AMBULATORY_CARE_PROVIDER_SITE_OTHER): Payer: BLUE CROSS/BLUE SHIELD | Admitting: Psychology

## 2019-01-02 DIAGNOSIS — F4321 Adjustment disorder with depressed mood: Secondary | ICD-10-CM | POA: Diagnosis not present

## 2019-01-16 ENCOUNTER — Ambulatory Visit: Payer: BLUE CROSS/BLUE SHIELD | Admitting: Psychology

## 2019-01-24 ENCOUNTER — Ambulatory Visit (INDEPENDENT_AMBULATORY_CARE_PROVIDER_SITE_OTHER): Payer: BLUE CROSS/BLUE SHIELD | Admitting: Psychology

## 2019-01-24 DIAGNOSIS — F411 Generalized anxiety disorder: Secondary | ICD-10-CM | POA: Diagnosis not present

## 2019-02-11 ENCOUNTER — Ambulatory Visit: Payer: BLUE CROSS/BLUE SHIELD | Admitting: Psychology

## 2024-11-17 ENCOUNTER — Other Ambulatory Visit: Payer: Self-pay

## 2024-11-17 ENCOUNTER — Encounter (HOSPITAL_COMMUNITY): Payer: Self-pay | Admitting: Pharmacy Technician

## 2024-11-17 ENCOUNTER — Emergency Department (HOSPITAL_COMMUNITY)
Admission: EM | Admit: 2024-11-17 | Discharge: 2024-11-17 | Disposition: A | Discharge status: Home / Self Care | Attending: Emergency Medicine | Admitting: Emergency Medicine

## 2024-11-17 DIAGNOSIS — Z79899 Other long term (current) drug therapy: Secondary | ICD-10-CM | POA: Insufficient documentation

## 2024-11-17 DIAGNOSIS — K0889 Other specified disorders of teeth and supporting structures: Secondary | ICD-10-CM | POA: Insufficient documentation

## 2024-11-17 MED ORDER — KETOROLAC TROMETHAMINE 10 MG PO TABS: 10.0000 mg | ORAL_TABLET | Freq: Four times a day (QID) | ORAL | 0 refills | Status: AC | PRN

## 2024-11-17 MED ORDER — CLINDAMYCIN HCL 150 MG PO CAPS
450.0000 mg | ORAL_CAPSULE | Freq: Once | ORAL | Status: AC
  Administered 2024-11-17: 450 mg via ORAL
  Filled 2024-11-17: qty 3

## 2024-11-17 MED ORDER — KETOROLAC TROMETHAMINE 10 MG PO TABS: 10.0000 mg | ORAL_TABLET | Freq: Four times a day (QID) | ORAL | 0 refills | Status: DC | PRN

## 2024-11-17 MED ORDER — KETOROLAC TROMETHAMINE 15 MG/ML IJ SOLN
15.0000 mg | Freq: Once | INTRAMUSCULAR | Status: AC
  Administered 2024-11-17: 15 mg via INTRAMUSCULAR
  Filled 2024-11-17: qty 1

## 2024-11-17 MED ORDER — CLINDAMYCIN HCL 150 MG PO CAPS: 450.0000 mg | ORAL_CAPSULE | Freq: Three times a day (TID) | ORAL | 0 refills | Status: DC

## 2024-11-17 MED ORDER — CLINDAMYCIN HCL 150 MG PO CAPS: 450.0000 mg | ORAL_CAPSULE | Freq: Three times a day (TID) | ORAL | 0 refills | Status: AC

## 2024-11-17 NOTE — ED Triage Notes (Signed)
 Pt arrives POV with reports of L sided dental pain onset Friday. Denies fevers.

## 2024-11-17 NOTE — Discharge Instructions (Signed)
 Begin taking clindamycin  as prescribed.  May take Toradol  every 6 hours as needed for pain.  Do not take extra ibuprofen at the same time.  Gargling with warm salt water  can help keep mouth clean.  Please call dentist tomorrow to schedule follow-up appointment for dental pain.  Return to ED if any symptoms worsen including new fevers, severe swelling in the mouth, difficulty breathing.
